# Patient Record
Sex: Male | Born: 1990 | Hispanic: No | Marital: Single | Smoking: Never smoker
Health system: Southern US, Community
[De-identification: ages and names within clinical notes are randomized; demographics above are authoritative.]

## PROBLEM LIST (undated history)

## (undated) DIAGNOSIS — J343 Hypertrophy of nasal turbinates: Secondary | ICD-10-CM

## (undated) HISTORY — PX: HAND SURGERY: SHX662

---

## 2010-04-17 HISTORY — PX: NASAL SEPTUM SURGERY: SHX37

## 2012-12-31 ENCOUNTER — Ambulatory Visit: Payer: Managed Care, Other (non HMO)

## 2012-12-31 ENCOUNTER — Ambulatory Visit (INDEPENDENT_AMBULATORY_CARE_PROVIDER_SITE_OTHER): Payer: Managed Care, Other (non HMO) | Admitting: Internal Medicine

## 2012-12-31 VITALS — BP 120/72 | HR 88 | Temp 97.6°F | Resp 18 | Ht 68.5 in | Wt 142.0 lb

## 2012-12-31 DIAGNOSIS — R05 Cough: Secondary | ICD-10-CM

## 2012-12-31 DIAGNOSIS — R0789 Other chest pain: Secondary | ICD-10-CM

## 2012-12-31 DIAGNOSIS — R059 Cough, unspecified: Secondary | ICD-10-CM

## 2012-12-31 DIAGNOSIS — Z23 Encounter for immunization: Secondary | ICD-10-CM

## 2012-12-31 DIAGNOSIS — M4120 Other idiopathic scoliosis, site unspecified: Secondary | ICD-10-CM
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
## 2013-07-14 ENCOUNTER — Ambulatory Visit (INDEPENDENT_AMBULATORY_CARE_PROVIDER_SITE_OTHER): Payer: Managed Care, Other (non HMO) | Admitting: Family Medicine

## 2013-07-14 VITALS — BP 124/72 | HR 108 | Temp 98.2°F | Resp 16 | Ht 68.75 in | Wt 152.8 lb

## 2013-07-14 DIAGNOSIS — J029 Acute pharyngitis, unspecified: Secondary | ICD-10-CM

## 2013-07-14 DIAGNOSIS — J020 Streptococcal pharyngitis: Secondary | ICD-10-CM

## 2013-07-14 LAB — POCT RAPID STREP A (OFFICE): Rapid Strep A Screen: POSITIVE — AB

## 2013-07-14 MED ORDER — AMOXICILLIN 875 MG PO TABS: 875.0000 mg | ORAL_TABLET | Freq: Two times a day (BID) | ORAL | Status: DC

## 2013-07-14 NOTE — Patient Instructions (Signed)
Strep Throat  Strep throat is an infection of the throat caused by a bacteria named Streptococcus pyogenes. Your caregiver may call the infection streptococcal "tonsillitis" or "pharyngitis" depending on whether there are signs of inflammation in the tonsils or back of the throat. Strep throat is most common in children aged 24 24 years during the cold months of the year, but it can occur in people of any age during any season. This infection is spread from person to person (contagious) through coughing, sneezing, or other close contact.  SYMPTOMS   · Fever or chills.  · Painful, swollen, red tonsils or throat.  · Pain or difficulty when swallowing.  · White or yellow spots on the tonsils or throat.  · Swollen, tender lymph nodes or "glands" of the neck or under the jaw.  · Red rash all over the body (rare).  DIAGNOSIS   Many different infections can cause the same symptoms. A test must be done to confirm the diagnosis so the right treatment can be given. A "rapid strep test" can help your caregiver make the diagnosis in a few minutes. If this test is not available, a light swab of the infected area can be used for a throat culture test. If a throat culture test is done, results are usually available in a day or two.  TREATMENT   Strep throat is treated with antibiotic medicine.  HOME CARE INSTRUCTIONS   · Gargle with 1 tsp of salt in 1 cup of warm water, 3 4 times per day or as needed for comfort.  · Family members who also have a sore throat or fever should be tested for strep throat and treated with antibiotics if they have the strep infection.  · Make sure everyone in your household washes their hands well.  · Do not share food, drinking cups, or personal items that could cause the infection to spread to others.  · You may need to eat a soft food diet until your sore throat gets better.  · Drink enough water and fluids to keep your urine clear or pale yellow. This will help prevent dehydration.  · Get plenty of  rest.  · Stay home from school, daycare, or work until you have been on antibiotics for 24 hours.  · Only take over-the-counter or prescription medicines for pain, discomfort, or fever as directed by your caregiver.  · If antibiotics are prescribed, take them as directed. Finish them even if you start to feel better.  SEEK MEDICAL CARE IF:   · The glands in your neck continue to enlarge.  · You develop a rash, cough, or earache.  · You cough up green, yellow-brown, or bloody sputum.  · You have pain or discomfort not controlled by medicines.  · Your problems seem to be getting worse rather than better.  SEEK IMMEDIATE MEDICAL CARE IF:   · You develop any new symptoms such as vomiting, severe headache, stiff or painful neck, chest pain, shortness of breath, or trouble swallowing.  · You develop severe throat pain, drooling, or changes in your voice.  · You develop swelling of the neck, or the skin on the neck becomes red and tender.  · You have a fever.  · You develop signs of dehydration, such as fatigue, dry mouth, and decreased urination.  · You become increasingly sleepy, or you cannot wake up completely.  Document Released: 03/31/2000 Document Revised: 03/20/2012 Document Reviewed: 06/02/2010  [employer]® Patient Information ©2014 [employer].

## 2013-07-14 NOTE — Progress Notes (Signed)
Patient ID: Antonio Gaines MRN: [id number], DOB: 10/04/1990, 23 y.o. Date of Encounter: 07/14/2013, 5:37 PM  Primary Physician: No primary provider on file.  Chief Complaint:  Chief Complaint  Patient presents with  . Sore Throat    x 3 wks  . Nasal Congestion  . Fever    one day a week ago    HPI: 23 y.o. year old male presents with 14 day history of sore throat. Subjective fever and chills. No cough, congestion, rhinorrhea, sinus pressure, otalgia, or headache. Normal hearing. No GI complaints. Able to swallow saliva, but hurts to do so. Decreased appetite secondary to sore throat.   No past medical history on file.   Home Meds: Prior to Admission medications   Medication Sig Start Date End Date Taking? Authorizing Provider  amoxicillin (AMOXIL) 875 MG tablet Take 1 tablet (875 mg total) by mouth 2 (two) times daily. 07/14/13   Elvina SidleKurt Shailey Butterbaugh, MD    Allergies: No Known Allergies  History   Social History  . Marital Status: Single    Spouse Name: N/A    Number of Children: N/A  . Years of Education: N/A   Occupational History  . Not on file.   Social History Main Topics  . Smoking status: Never Smoker   . Smokeless tobacco: Not on file  . Alcohol Use: Not on file  . Drug Use: Not on file  . Sexual Activity: Not on file   Other Topics Concern  . Not on file   Social History Narrative  . No narrative on file     Review of Systems: Constitutional: negative for chills, fever, night sweats or weight changes HEENT: see above Cardiovascular: negative for chest pain or palpitations Respiratory: negative for hemoptysis, wheezing, or shortness of breath Abdominal: negative for abdominal pain, nausea, vomiting or diarrhea Dermatological: negative for rash Neurologic: negative for headache   Physical Exam: Blood pressure 124/72, pulse 108, temperature 98.2 F (36.8 C), temperature source Oral, resp. rate 16, height 5' 8.75" (1.746 m), weight 152 lb 12.8 oz  (69.31 kg), SpO2 98.00%., Body mass index is 22.74 kg/(m^2). General: Well developed, well nourished, in no acute distress. Head: Normocephalic, atraumatic, eyes without discharge, sclera non-icteric, nares are patent. Bilateral auditory canals clear, TM's are without perforation, pearly grey with reflective cone of light bilaterally. No sinus TTP. Oral cavity moist, dentition normal. Posterior pharynx with post nasal drip and mild erythema. No peritonsillar abscess or tonsillar exudate. Neck: Supple. No thyromegaly. Full ROM. No lymphadenopathy. Lungs: Clear bilaterally to auscultation without wheezes, rales, or rhonchi. Breathing is unlabored. Heart: RRR with S1 S2. No murmurs, rubs, or gallops appreciated. Abdomen: Soft, non-tender, non-distended with normoactive bowel sounds. No hepatomegaly. No rebound/guarding. No obvious abdominal masses. Msk:  Strength and tone normal for age. Extremities: No clubbing or cyanosis. No edema. Neuro: Alert and oriented X 3. Moves all extremities spontaneously. CNII-XII grossly in tact. Psych:  Responds to questions appropriately with a normal affect.   Labs: Results for orders placed in visit on 07/14/13  POCT RAPID STREP A (OFFICE)      Result Value Ref Range   Rapid Strep A Screen Positive (*) Negative     ASSESSMENT AND PLAN:  23 y.o. year old male with Acute pharyngitis - Plan: POCT rapid strep A, amoxicillin (AMOXIL) 875 MG tablet  Streptococcal sore throat - Plan: amoxicillin (AMOXIL) 875 MG tablet   - -Tylenol/Motrin prn -Rest/fluids -RTC precautions -RTC 3-5 days if no improvement  Signed, Elvina Sidle, MD 07/14/2013 5:37 PM

## 2013-08-08 ENCOUNTER — Other Ambulatory Visit: Payer: Self-pay | Admitting: Family Medicine
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
## 2016-03-31 ENCOUNTER — Other Ambulatory Visit: Payer: Self-pay | Admitting: Otolaryngology

## 2016-04-17 DIAGNOSIS — J343 Hypertrophy of nasal turbinates: Secondary | ICD-10-CM

## 2016-04-17 HISTORY — DX: Hypertrophy of nasal turbinates: J34.3

## 2016-05-08 ENCOUNTER — Encounter (HOSPITAL_BASED_OUTPATIENT_CLINIC_OR_DEPARTMENT_OTHER): Payer: Self-pay | Admitting: *Deleted

## 2016-05-15 ENCOUNTER — Ambulatory Visit (HOSPITAL_BASED_OUTPATIENT_CLINIC_OR_DEPARTMENT_OTHER): Payer: BLUE CROSS/BLUE SHIELD | Admitting: Anesthesiology

## 2016-05-15 ENCOUNTER — Ambulatory Visit (HOSPITAL_BASED_OUTPATIENT_CLINIC_OR_DEPARTMENT_OTHER)
Admission: RE | Admit: 2016-05-15 | Discharge: 2016-05-15 | Disposition: A | Discharge status: Home / Self Care | Payer: BLUE CROSS/BLUE SHIELD | Source: Ambulatory Visit | Attending: Otolaryngology | Admitting: Otolaryngology

## 2016-05-15 ENCOUNTER — Encounter (HOSPITAL_BASED_OUTPATIENT_CLINIC_OR_DEPARTMENT_OTHER): Payer: Self-pay | Admitting: Anesthesiology

## 2016-05-15 ENCOUNTER — Encounter (HOSPITAL_BASED_OUTPATIENT_CLINIC_OR_DEPARTMENT_OTHER)
Admission: RE | Disposition: A | Discharge status: Home / Self Care | Payer: Self-pay | Source: Ambulatory Visit | Attending: Otolaryngology

## 2016-05-15 DIAGNOSIS — R065 Mouth breathing: Secondary | ICD-10-CM | POA: Insufficient documentation

## 2016-05-15 DIAGNOSIS — J343 Hypertrophy of nasal turbinates: Secondary | ICD-10-CM | POA: Insufficient documentation

## 2016-05-15 DIAGNOSIS — J45909 Unspecified asthma, uncomplicated: Secondary | ICD-10-CM | POA: Insufficient documentation

## 2016-05-15 DIAGNOSIS — J3489 Other specified disorders of nose and nasal sinuses: Secondary | ICD-10-CM | POA: Diagnosis not present

## 2016-05-15 DIAGNOSIS — J310 Chronic rhinitis: Secondary | ICD-10-CM | POA: Insufficient documentation

## 2016-05-15 DIAGNOSIS — H6123 Impacted cerumen, bilateral: Secondary | ICD-10-CM | POA: Diagnosis not present

## 2016-05-15 HISTORY — PX: TURBINATE REDUCTION: SHX6157

## 2016-05-15 HISTORY — DX: Hypertrophy of nasal turbinates: J34.3

## 2016-05-15 SURGERY — REDUCTION, NASAL TURBINATE
Anesthesia: General | Site: Nose | Laterality: Bilateral

## 2016-05-15 MED ORDER — LIDOCAINE HCL (CARDIAC) 20 MG/ML IV SOLN
INTRAVENOUS | Status: DC | PRN
  Administered 2016-05-15: 100 mg via INTRAVENOUS

## 2016-05-15 MED ORDER — DEXAMETHASONE SODIUM PHOSPHATE 4 MG/ML IJ SOLN
INTRAMUSCULAR | Status: DC | PRN
  Administered 2016-05-15: 10 mg via INTRAVENOUS

## 2016-05-15 MED ORDER — MIDAZOLAM HCL 2 MG/2ML IJ SOLN
INTRAMUSCULAR | Status: AC
  Filled 2016-05-15: qty 2

## 2016-05-15 MED ORDER — OXYMETAZOLINE HCL 0.05 % NA SOLN
NASAL | Status: AC
  Filled 2016-05-15: qty 15

## 2016-05-15 MED ORDER — SUCCINYLCHOLINE CHLORIDE 20 MG/ML IJ SOLN
INTRAMUSCULAR | Status: DC | PRN
  Administered 2016-05-15: 100 mg via INTRAVENOUS

## 2016-05-15 MED ORDER — OXYMETAZOLINE HCL 0.05 % NA SOLN
NASAL | Status: DC | PRN
  Administered 2016-05-15: 1 via TOPICAL

## 2016-05-15 MED ORDER — PROMETHAZINE HCL 25 MG/ML IJ SOLN: 6.2500 mg | INTRAMUSCULAR | Status: DC | PRN

## 2016-05-15 MED ORDER — MIDAZOLAM HCL 2 MG/2ML IJ SOLN: 0.5000 mg | Freq: Once | INTRAMUSCULAR | Status: DC | PRN

## 2016-05-15 MED ORDER — MIDAZOLAM HCL 2 MG/2ML IJ SOLN
1.0000 mg | INTRAMUSCULAR | Status: DC | PRN
  Administered 2016-05-15: 2 mg via INTRAVENOUS

## 2016-05-15 MED ORDER — LACTATED RINGERS IV SOLN
INTRAVENOUS | Status: DC
  Administered 2016-05-15 (×2): via INTRAVENOUS

## 2016-05-15 MED ORDER — MUPIROCIN 2 % EX OINT
TOPICAL_OINTMENT | CUTANEOUS | Status: AC
  Filled 2016-05-15: qty 22

## 2016-05-15 MED ORDER — ARTIFICIAL TEARS OP OINT
TOPICAL_OINTMENT | OPHTHALMIC | Status: AC
  Filled 2016-05-15: qty 3.5

## 2016-05-15 MED ORDER — FENTANYL CITRATE (PF) 100 MCG/2ML IJ SOLN
50.0000 ug | INTRAMUSCULAR | Status: DC | PRN
  Administered 2016-05-15: 100 ug via INTRAVENOUS

## 2016-05-15 MED ORDER — FENTANYL CITRATE (PF) 100 MCG/2ML IJ SOLN
INTRAMUSCULAR | Status: AC
Start: 2016-05-15 — End: 2016-05-15
  Filled 2016-05-15: qty 2

## 2016-05-15 MED ORDER — MEPERIDINE HCL 25 MG/ML IJ SOLN: 6.2500 mg | INTRAMUSCULAR | Status: DC | PRN

## 2016-05-15 MED ORDER — ONDANSETRON HCL 4 MG/2ML IJ SOLN
INTRAMUSCULAR | Status: AC
  Filled 2016-05-15: qty 2

## 2016-05-15 MED ORDER — LIDOCAINE 2% (20 MG/ML) 5 ML SYRINGE
INTRAMUSCULAR | Status: AC
  Filled 2016-05-15: qty 5

## 2016-05-15 MED ORDER — SCOPOLAMINE 1 MG/3DAYS TD PT72
1.0000 | MEDICATED_PATCH | Freq: Once | TRANSDERMAL | Status: DC | PRN
Start: 2016-05-15 — End: 2016-05-15

## 2016-05-15 MED ORDER — AMOXICILLIN 875 MG PO TABS: 875.0000 mg | ORAL_TABLET | Freq: Two times a day (BID) | ORAL | 0 refills | Status: AC

## 2016-05-15 MED ORDER — OXYCODONE-ACETAMINOPHEN 5-325 MG PO TABS: 1.0000 | ORAL_TABLET | ORAL | 0 refills | Status: AC | PRN

## 2016-05-15 MED ORDER — PROPOFOL 10 MG/ML IV BOLUS
INTRAVENOUS | Status: DC | PRN
  Administered 2016-05-15: 200 mg via INTRAVENOUS

## 2016-05-15 MED ORDER — SUCCINYLCHOLINE CHLORIDE 200 MG/10ML IV SOSY
PREFILLED_SYRINGE | INTRAVENOUS | Status: AC
  Filled 2016-05-15: qty 10

## 2016-05-15 MED ORDER — DEXAMETHASONE SODIUM PHOSPHATE 10 MG/ML IJ SOLN
INTRAMUSCULAR | Status: AC
  Filled 2016-05-15: qty 1

## 2016-05-15 MED ORDER — FENTANYL CITRATE (PF) 100 MCG/2ML IJ SOLN: 25.0000 ug | INTRAMUSCULAR | Status: DC | PRN

## 2016-05-15 MED ORDER — ONDANSETRON HCL 4 MG/2ML IJ SOLN
INTRAMUSCULAR | Status: DC | PRN
  Administered 2016-05-15: 4 mg via INTRAVENOUS

## 2016-05-15 SURGICAL SUPPLY — 24 items
ATTRACTOMAT 16X20 MAGNETIC DRP (DRAPES) IMPLANT
CANISTER SUCT 1200ML W/VALVE (MISCELLANEOUS) ×2 IMPLANT
COAGULATOR SUCT 8FR VV (MISCELLANEOUS) ×2 IMPLANT
DECANTER SPIKE VIAL GLASS SM (MISCELLANEOUS) IMPLANT
ELECT REM PT RETURN 9FT ADLT (ELECTROSURGICAL) ×2
ELECTRODE REM PT RTRN 9FT ADLT (ELECTROSURGICAL) ×1 IMPLANT
GLOVE BIO SURGEON STRL SZ 6.5 (GLOVE) ×2 IMPLANT
GLOVE BIO SURGEON STRL SZ7.5 (GLOVE) ×2 IMPLANT
GLOVE BIOGEL PI IND STRL 7.0 (GLOVE) ×1 IMPLANT
GLOVE BIOGEL PI INDICATOR 7.0 (GLOVE) ×1
GOWN STRL REUS W/ TWL LRG LVL3 (GOWN DISPOSABLE) ×2 IMPLANT
GOWN STRL REUS W/TWL LRG LVL3 (GOWN DISPOSABLE) ×2
NEEDLE HYPO 25X1 1.5 SAFETY (NEEDLE) IMPLANT
NS IRRIG 1000ML POUR BTL (IV SOLUTION) ×2 IMPLANT
PACK BASIN DAY SURGERY FS (CUSTOM PROCEDURE TRAY) ×2 IMPLANT
PACK ENT DAY SURGERY (CUSTOM PROCEDURE TRAY) ×2 IMPLANT
PACKING NASAL EPIS 4X2.4 XEROG (MISCELLANEOUS) IMPLANT
PATTIES SURGICAL .5 X3 (DISPOSABLE) IMPLANT
SLEEVE SCD COMPRESS KNEE MED (MISCELLANEOUS) IMPLANT
SOLUTION BUTLER CLEAR DIP (MISCELLANEOUS) ×2 IMPLANT
SPONGE GAUZE 2X2 8PLY STRL LF (GAUZE/BANDAGES/DRESSINGS) ×2 IMPLANT
SPONGE NEURO XRAY DETECT 1X3 (DISPOSABLE) ×2 IMPLANT
TOWEL OR 17X24 6PK STRL BLUE (TOWEL DISPOSABLE) ×2 IMPLANT
YANKAUER SUCT BULB TIP NO VENT (SUCTIONS) ×2 IMPLANT

## 2016-05-15 NOTE — Transfer of Care (Signed)
Immediate Anesthesia Transfer of Care Note  Patient: Antonio Gaines T Choe  Procedure(s) Performed: Procedure(s): BILATERAL TURBINATE REDUCTION (Bilateral)  Patient Location: PACU  Anesthesia Type:General  Level of Consciousness: awake  Airway & Oxygen Therapy: Patient Spontanous Breathing and Patient connected to face mask oxygen  Post-op Assessment: Report given to RN and Post -op Vital signs reviewed and stable  Post vital signs: Reviewed and stable  Last Vitals:  Vitals:   05/15/16 0818  BP: 135/76  Pulse: 69  Resp: 18  Temp: 36.7 C    Last Pain:  Vitals:   05/15/16 0818  TempSrc: Oral  PainSc: 0-No pain         Complications: No apparent anesthesia complications

## 2016-05-15 NOTE — H&P (Signed)
Cc: Chronic nasal obstruction  HPI: The patient is a 26 year old male who presents today complaining of chronic nasal obstruction for the past 2 years.  He has been using Afrin nasal spray intermittently to treat his nasal obstruction.  He is an habitual mouth breather.  The patient denies any history of nasal trauma.  [place] It resulted in temporary improvement in his nasal breathing.  The patient denies any recent sinusitis.  He has no other history of ENT surgery. The patient has previously tried Flonase nasal spray and Allegra without significant improvement in his symptoms.  Currently he denies any facial pain, fever or visual change.    The patient's review of systems (constitutional, eyes, ENT, cardiovascular, respiratory, GI, musculoskeletal, skin, neurologic, psychiatric, endocrine, hematologic, allergic) is noted in the ROS questionnaire.  It is reviewed with the patient.  Family health history: None.  Major events: Septoplasty. Ongoing medical problems: Asthma.  Social history: The patient is single. He denies the use of tobacco or illegal drugs. He drinks alcohol 3 times a month.   Exam General: Communicates without difficulty, well nourished, no acute distress. Head: Normocephalic, no evidence injury, no tenderness, facial buttresses intact without stepoff. Eyes: PERRL, EOMI. No scleral icterus, conjunctivae clear. Neuro: CN II exam reveals vision grossly intact.  No nystagmus at any point of gaze. Ears: Auricles well formed without lesions.  EAC: Bilateral cerumen impaction.  Under the operating microscope, the cerumen is carefully removed with a combination of cerumen currette, alligator forceps, and suction catheters.  After the cerumen is removed, the TMs are noted to be normal.  No mass, erythema, or lesions.  Nose: External evaluation reveals normal support and skin without lesions.  Dorsum is intact.  Anterior  rhinoscopy reveals congested mucosa over anterior aspect of inferior turbinates and intact septum. Oral:  Oral cavity and oropharynx are intact, symmetric, without erythema or edema.  Mucosa is moist without lesions. Neck: Full range of motion without pain.  There is no significant lymphadenopathy.  No masses palpable.  Thyroid bed within normal limits to palpation.  Parotid glands and submandibular glands equal bilaterally without mass.  Trachea is midline. Neuro:  CN 2-12 grossly intact. Gait normal.   Procedure:  Flexible Nasal Endoscopy: Risks, benefits, and alternatives of flexible endoscopy were explained to the patient.  Specific mention was made of the risk of throat numbness with difficulty swallowing, possible bleeding from the nose and mouth, and pain from the procedure.  The patient gave oral consent to proceed.  The nasal cavities were decongested and anesthetised with a combination of oxymetazoline and 4% lidocaine solution.  The flexible scope was inserted into the right nasal cavity.  Endoscopy of the inferior and middle meatus was performed.  The edematous mucosa was as described above.  No polyp, mass, or lesion was appreciated.  Olfactory cleft was clear.  Nasopharynx was clear.  Turbinates were hypertrophied but without mass.  Incomplete response to decongestion.  The procedure was repeated on the contralateral side with similar findings.  The patient tolerated the procedure well.  Instructions were given to avoid eating or drinking for 2 hours.   Assessment 1.  Chronic rhinitis with severe nasal mucosal congestion and bilateral turbinate hypertrophy.  No polyps, mass, or lesion is noted today.  2.  More than 95% of his nasal passageways are obstructed bilaterally.   3.  Incidental findings of bilateral cerumen impaction.    Plan  1.  Otomicroscopy with bilateral cerumen disimpaction.  2.  The physical exam and nasal endoscopy findings are reviewed with the patient.  3.  Prednisone  Dosepak for 12 days.  4.  Flonase nasal spray daily.   5.  Based on the severity of his turbinate hypertrophy, he may benefit from undergoing revision turbinate reduction surgery.  The risks, benefits, alternatives and details of the procedure are reviewed with the patient.

## 2016-05-15 NOTE — Anesthesia Procedure Notes (Signed)
Procedure Name: Intubation Date/Time: 05/15/2016 9:45 AM Performed by: Caren MacadamARTER, Kellan Raffield W Pre-anesthesia Checklist: Patient identified, Emergency Drugs available, Suction available and Patient being monitored Patient Re-evaluated:Patient Re-evaluated prior to inductionOxygen Delivery Method: Circle system utilized Preoxygenation: Pre-oxygenation with 100% oxygen Intubation Type: IV induction Ventilation: Mask ventilation without difficulty Laryngoscope Size: Miller and 2 Grade View: Grade I Tube type: Oral Tube size: 8.0 mm Number of attempts: 1 Airway Equipment and Method: Stylet and Oral airway Placement Confirmation: ETT inserted through vocal cords under direct vision,  positive ETCO2 and breath sounds checked- equal and bilateral Secured at: 22 cm Tube secured with: Tape Dental Injury: Teeth and Oropharynx as per pre-operative assessment

## 2016-05-15 NOTE — Anesthesia Preprocedure Evaluation (Addendum)
Anesthesia Evaluation  Patient identified by MRN, date of birth, ID band Patient awake    Reviewed: Allergy & Precautions, NPO status , Patient's Chart, lab work & pertinent test results  History of Anesthesia Complications Negative for: history of anesthetic complications  Airway Mallampati: II  TM Distance: >3 FB Neck ROM: Full    Dental  (+) Dental Advisory Given   Pulmonary neg pulmonary ROS,    breath sounds clear to auscultation       Cardiovascular negative cardio ROS   Rhythm:Regular Rate:Normal     Neuro/Psych negative neurological ROS     GI/Hepatic negative GI ROS, Neg liver ROS,   Endo/Other  negative endocrine ROS  Renal/GU negative Renal ROS     Musculoskeletal   Abdominal   Peds  Hematology negative hematology ROS (+)   Anesthesia Other Findings   Reproductive/Obstetrics                             Anesthesia Physical Anesthesia Plan  ASA: I  Anesthesia Plan: General   Post-op Pain Management:    Induction: Intravenous  Airway Management Planned: Oral ETT  Additional Equipment:   Intra-op Plan:   Post-operative Plan: Extubation in OR  Informed Consent: I have reviewed the patients History and Physical, chart, labs and discussed the procedure including the risks, benefits and alternatives for the proposed anesthesia with the patient or authorized representative who has indicated his/her understanding and acceptance.   Dental advisory given  Plan Discussed with: CRNA and Surgeon  Anesthesia Plan Comments: (Plan routine monitors, GETA)        Anesthesia Quick Evaluation

## 2016-05-15 NOTE — Anesthesia Postprocedure Evaluation (Signed)
Anesthesia Post Note  Patient: Antonio Gaines  Procedure(s) Performed: Procedure(s) (LRB): BILATERAL TURBINATE REDUCTION (Bilateral)  Patient location during evaluation: PACU Anesthesia Type: General Level of consciousness: awake and alert, patient cooperative and oriented Pain management: pain level controlled Vital Signs Assessment: post-procedure vital signs reviewed and stable Respiratory status: spontaneous breathing, nonlabored ventilation and respiratory function stable Cardiovascular status: blood pressure returned to baseline and stable Postop Assessment: no signs of nausea or vomiting Anesthetic complications: no       Last Vitals:  Vitals:   05/15/16 1100 05/15/16 1140  BP: 119/87 140/81  Pulse: 98 83  Resp: 10 16  Temp:  37.1 C    Last Pain:  Vitals:   05/15/16 1140  TempSrc:   PainSc: 1                  Eliah Ozawa,E. Boby Eyer

## 2016-05-15 NOTE — Discharge Instructions (Addendum)

## 2016-05-15 NOTE — Op Note (Signed)
DATE OF PROCEDURE: 05/15/2016  OPERATIVE REPORT   SURGEON: Antonio PiesSu Eiko Mcgowen, MD   PREOPERATIVE DIAGNOSES:  1. Chronic nasal obstruction.  2. Bilateral inferior turbinate hypertrophy.   POSTOPERATIVE DIAGNOSES:  1. Chronic nasal obstruction.  2. Bilateral inferior turbinate hypertrophy.   PROCEDURE PERFORMED: Bilateral partial inferior turbinate resection.   ANESTHESIA: General endotracheal tube anesthesia.   COMPLICATIONS: None.   ESTIMATED BLOOD LOSS: Minimal.   INDICATION FOR PROCEDURE :Antonio Gaines is a 26 y.o. male with a history of chronic nasal obstruction. The patient was treated with antihistamine, decongestant, steroid nasal spray, and systemic steroids. However, the patient continues to be symptomatic. On examination, the patient was noted to have bilateral severe inferior turbinate hypertrophy, causing significant nasal obstruction. Based on the above findings, the decision was made for the patient to undergo the above-stated procedure. The risks, benefits, alternatives, and details of the procedure were discussed with the patient. Questions were invited and answered. Informed consent was obtained.   DESCRIPTION: The patient was taken to the operating room and placed supine on the operating table. General endotracheal tube anesthesia was administered by the anesthesiologist. The patient was positioned and prepped and draped in a standard fashion for nasal surgery. Pledgets soaked with Afrin were placed in both nasal cavities. The pledgets were subsequently removed. Examination of the nasal cavities revealed bilateral severe inferior turbinate hypertrophy. The inferior one-half of each inferior turbinate was then crossclamped with a straight Kelly clamp. The inferior one-half of each inferior turbinate was then resected with a pair of cross cutting scissors. Hemostasis was achieved with suction electrocautery, under direct visual guidance of the zero-degree endoscope. Good hemostasis was  achieved. The care of the patient was turned over to the anesthesiologist. The patient was awakened from anesthesia without difficulty. The patient was extubated and transferred to the recovery room in good condition.   OPERATIVE FINDINGS: Bilateral inferior turbinate hypertrophy.   SPECIMEN: None.   FOLLOWUP CARE: The patient will be discharged home once he is awake and alert. The patient will be placed on Percocet 1-2 tablets p.o. q.6 h. p.r.n. pain, and amoxicillin 875 mg p.o. b.i.d. for 5 days. The patient will follow up in my office in approximately 1 week.   Antonio PiesSu [name], MD

## 2016-05-16 ENCOUNTER — Encounter (HOSPITAL_BASED_OUTPATIENT_CLINIC_OR_DEPARTMENT_OTHER): Payer: Self-pay | Admitting: Otolaryngology

## 2017-10-23 ENCOUNTER — Other Ambulatory Visit: Payer: Self-pay | Admitting: Physician Assistant

## 2017-10-23 DIAGNOSIS — R109 Unspecified abdominal pain: Secondary | ICD-10-CM

## 2017-10-31 ENCOUNTER — Other Ambulatory Visit: Payer: BLUE CROSS/BLUE SHIELD

## 2017-11-02 ENCOUNTER — Ambulatory Visit
Admission: RE | Admit: 2017-11-02 | Discharge: 2017-11-02 | Disposition: A | Discharge status: Home / Self Care | Payer: BLUE CROSS/BLUE SHIELD | Source: Ambulatory Visit | Attending: Physician Assistant | Admitting: Physician Assistant

## 2017-11-02 DIAGNOSIS — R109 Unspecified abdominal pain: Secondary | ICD-10-CM

## 2019-02-14 ENCOUNTER — Other Ambulatory Visit: Payer: Self-pay

## 2019-02-14 DIAGNOSIS — Z20822 Contact with and (suspected) exposure to covid-19: Secondary | ICD-10-CM

## 2019-02-17 LAB — NOVEL CORONAVIRUS, NAA: SARS-CoV-2, NAA: NOT DETECTED

## 2019-07-18 ENCOUNTER — Ambulatory Visit: Payer: Self-pay | Attending: Internal Medicine

## 2019-07-18 DIAGNOSIS — Z23 Encounter for immunization: Secondary | ICD-10-CM

## 2019-07-18 NOTE — Progress Notes (Signed)
   Covid-19 Vaccination Clinic  Name:  Antonio Gaines    MRN: [id number] DOB: 12-10-1990  07/18/2019  [name] was observed post Covid-19 immunization for 15 minutes without incident. He was provided with Vaccine Information Sheet and instruction to access the V-Safe system.   [name] was instructed to call 911 with any severe reactions post vaccine: Marland Kitchen Difficulty breathing  . Swelling of face and throat  . A fast heartbeat  . A bad rash all over body  . Dizziness and weakness   Immunizations Administered    Name Date Dose VIS Date Route   Pfizer COVID-19 Vaccine 07/18/2019  1:47 PM 0.3 mL 03/28/2019 Intramuscular   Manufacturer: [employer], [employer]   Lot: [id number]   NDC: [address]-9

## 2019-08-12 ENCOUNTER — Ambulatory Visit: Payer: Self-pay | Attending: Internal Medicine

## 2019-08-12 DIAGNOSIS — Z23 Encounter for immunization: Secondary | ICD-10-CM

## 2019-08-12 NOTE — Progress Notes (Signed)
   Covid-19 Vaccination Clinic  Name:  Antonio Gaines    MRN: [id number] DOB: 1990/08/25  08/12/2019  Antonio Gaines was observed post Covid-19 immunization for 15 minutes without incident. He was provided with Vaccine Information Sheet and instruction to access the V-Safe system.   Antonio Gaines was instructed to call 911 with any severe reactions post vaccine: Marland Kitchen Difficulty breathing  . Swelling of face and throat  . A fast heartbeat  . A bad rash all over body  . Dizziness and weakness   Immunizations Administered    Name Date Dose VIS Date Route   Pfizer COVID-19 Vaccine 08/12/2019  4:29 PM 0.3 mL 06/11/2018 Intramuscular   Manufacturer: [employer], [employer]   Lot: [id number]   NDC: [address]-2
